# Patient Record
Sex: Male | Born: 1940 | Race: White | Hispanic: No | Marital: Married | Smoking: Never smoker
Health system: Southern US, Community
[De-identification: ages and names within clinical notes are randomized; demographics above are authoritative.]

## PROBLEM LIST (undated history)

## (undated) DIAGNOSIS — F0391 Unspecified dementia with behavioral disturbance: Secondary | ICD-10-CM

## (undated) DIAGNOSIS — F03918 Unspecified dementia, unspecified severity, with other behavioral disturbance: Secondary | ICD-10-CM

## (undated) DIAGNOSIS — G40909 Epilepsy, unspecified, not intractable, without status epilepticus: Secondary | ICD-10-CM

## (undated) DIAGNOSIS — F0280 Dementia in other diseases classified elsewhere without behavioral disturbance: Secondary | ICD-10-CM

## (undated) DIAGNOSIS — I10 Essential (primary) hypertension: Secondary | ICD-10-CM

## (undated) DIAGNOSIS — G309 Alzheimer's disease, unspecified: Secondary | ICD-10-CM

## (undated) HISTORY — DX: Epilepsy, unspecified, not intractable, without status epilepticus: G40.909

## (undated) HISTORY — DX: Unspecified dementia, unspecified severity, with other behavioral disturbance: F03.918

## (undated) HISTORY — DX: Unspecified dementia with behavioral disturbance: F03.91

## (undated) HISTORY — DX: Dementia in other diseases classified elsewhere, unspecified severity, without behavioral disturbance, psychotic disturbance, mood disturbance, and anxiety: F02.80

## (undated) HISTORY — DX: Alzheimer's disease, unspecified: G30.9

## (undated) HISTORY — DX: Essential (primary) hypertension: I10

---

## 2005-07-01 ENCOUNTER — Ambulatory Visit: Payer: Self-pay | Admitting: Internal Medicine

## 2005-09-05 ENCOUNTER — Ambulatory Visit: Payer: Self-pay | Admitting: Psychiatry

## 2008-01-02 ENCOUNTER — Inpatient Hospital Stay: Payer: Self-pay | Admitting: Internal Medicine

## 2008-01-02 ENCOUNTER — Other Ambulatory Visit: Payer: Self-pay

## 2010-09-26 ENCOUNTER — Emergency Department: Payer: Self-pay | Admitting: Emergency Medicine

## 2010-10-01 ENCOUNTER — Emergency Department: Payer: Self-pay | Admitting: Emergency Medicine

## 2010-12-14 ENCOUNTER — Emergency Department (HOSPITAL_COMMUNITY)
Admission: EM | Admit: 2010-12-14 | Discharge: 2010-12-14 | Disposition: A | Discharge status: Home / Self Care | Payer: Medicare Other | Attending: Emergency Medicine | Admitting: Emergency Medicine

## 2010-12-14 DIAGNOSIS — Y93E8 Activity, other personal hygiene: Secondary | ICD-10-CM | POA: Insufficient documentation

## 2010-12-14 DIAGNOSIS — S51009A Unspecified open wound of unspecified elbow, initial encounter: Secondary | ICD-10-CM | POA: Insufficient documentation

## 2010-12-14 DIAGNOSIS — F0390 Unspecified dementia without behavioral disturbance: Secondary | ICD-10-CM | POA: Insufficient documentation

## 2010-12-14 DIAGNOSIS — Y9210 Unspecified residential institution as the place of occurrence of the external cause: Secondary | ICD-10-CM | POA: Insufficient documentation

## 2010-12-14 DIAGNOSIS — W1809XA Striking against other object with subsequent fall, initial encounter: Secondary | ICD-10-CM | POA: Insufficient documentation

## 2010-12-14 DIAGNOSIS — Z79899 Other long term (current) drug therapy: Secondary | ICD-10-CM | POA: Insufficient documentation

## 2011-06-08 ENCOUNTER — Observation Stay: Payer: Self-pay | Admitting: Internal Medicine

## 2011-06-08 ENCOUNTER — Emergency Department: Payer: Self-pay | Admitting: Emergency Medicine

## 2011-07-19 ENCOUNTER — Emergency Department: Payer: Self-pay | Admitting: Emergency Medicine

## 2011-07-19 LAB — URINALYSIS, COMPLETE
Ketone: NEGATIVE
Leukocyte Esterase: NEGATIVE
Nitrite: NEGATIVE
Protein: NEGATIVE
RBC,UR: 1 /HPF (ref 0–5)
WBC UR: 1 /HPF (ref 0–5)

## 2011-07-19 LAB — BASIC METABOLIC PANEL
BUN: 18 mg/dL (ref 7–18)
Calcium, Total: 9.1 mg/dL (ref 8.5–10.1)
Chloride: 106 mmol/L (ref 98–107)
EGFR (African American): >60
EGFR (Non-African Amer.): >60
Osmolality: 285 (ref 275–301)
Potassium: 4.1 mmol/L (ref 3.5–5.1)
Sodium: 142 mmol/L (ref 136–145)

## 2011-07-19 LAB — CBC
MCH: 31.1 pg (ref 26.0–34.0)
MCHC: 34 g/dL (ref 32.0–36.0)
Platelet: 138 10*3/uL — ABNORMAL LOW (ref 150–440)
RBC: 4.14 10*6/uL — ABNORMAL LOW (ref 4.40–5.90)

## 2011-07-22 ENCOUNTER — Emergency Department: Payer: Self-pay | Admitting: Unknown Physician Specialty

## 2011-09-14 ENCOUNTER — Observation Stay: Payer: Self-pay | Admitting: Internal Medicine

## 2011-09-14 LAB — COMPREHENSIVE METABOLIC PANEL
Anion Gap: 12 (ref 7–16)
Calcium, Total: 8.9 mg/dL (ref 8.5–10.1)
Chloride: 109 mmol/L — ABNORMAL HIGH (ref 98–107)
EGFR (African American): >60
Glucose: 89 mg/dL (ref 65–99)
Potassium: 4.2 mmol/L (ref 3.5–5.1)
SGOT(AST): 30 U/L (ref 15–37)

## 2011-09-14 LAB — CBC
HCT: 36.1 % — ABNORMAL LOW (ref 40.0–52.0)
MCH: 31.6 pg (ref 26.0–34.0)
MCV: 92 fL (ref 80–100)
Platelet: 125 10*3/uL — ABNORMAL LOW (ref 150–440)
RBC: 3.93 10*6/uL — ABNORMAL LOW (ref 4.40–5.90)
RDW: 13.3 % (ref 11.5–14.5)
WBC: 4.7 10*3/uL (ref 3.8–10.6)

## 2011-09-14 LAB — CK TOTAL AND CKMB (NOT AT ARMC): CK-MB: 2.7 ng/mL (ref 0.5–3.6)

## 2011-09-14 LAB — TSH: Thyroid Stimulating Horm: 0.862 u[IU]/mL

## 2011-09-15 LAB — CBC WITH DIFFERENTIAL/PLATELET
Basophil #: 0 10*3/uL (ref 0.0–0.1)
Basophil %: 0.5 %
Eosinophil #: 0.1 10*3/uL (ref 0.0–0.7)
Eosinophil %: 1.9 %
HCT: 36.3 % — ABNORMAL LOW (ref 40.0–52.0)
HGB: 12.5 g/dL — ABNORMAL LOW (ref 13.0–18.0)
Lymphocyte #: 1.3 10*3/uL (ref 1.0–3.6)
MCH: 31.5 pg (ref 26.0–34.0)
MCV: 91 fL (ref 80–100)
Monocyte %: 4.8 %
Neutrophil #: 3.3 10*3/uL (ref 1.4–6.5)
Platelet: 110 10*3/uL — ABNORMAL LOW (ref 150–440)
RBC: 3.98 10*6/uL — ABNORMAL LOW (ref 4.40–5.90)

## 2011-09-15 LAB — BASIC METABOLIC PANEL
Anion Gap: 12 (ref 7–16)
BUN: 14 mg/dL (ref 7–18)
Calcium, Total: 8.4 mg/dL — ABNORMAL LOW (ref 8.5–10.1)
Chloride: 108 mmol/L — ABNORMAL HIGH (ref 98–107)
EGFR (Non-African Amer.): >60
Glucose: 83 mg/dL (ref 65–99)
Osmolality: 288 (ref 275–301)
Potassium: 3.7 mmol/L (ref 3.5–5.1)

## 2011-09-15 LAB — CK TOTAL AND CKMB (NOT AT ARMC): CK, Total: 45 U/L (ref 35–232)

## 2011-09-15 LAB — TROPONIN I: Troponin-I: <0.02 ng/mL

## 2011-09-28 ENCOUNTER — Emergency Department: Payer: Self-pay | Admitting: Emergency Medicine

## 2011-09-28 LAB — URINALYSIS, COMPLETE
Bilirubin,UR: NEGATIVE
Glucose,UR: NEGATIVE mg/dL (ref 0–75)
Leukocyte Esterase: NEGATIVE
Nitrite: NEGATIVE
RBC,UR: 4 /HPF (ref 0–5)
Specific Gravity: 1.017 (ref 1.003–1.030)
Squamous Epithelial: 1
WBC UR: 9 /HPF (ref 0–5)

## 2011-09-28 LAB — CBC WITH DIFFERENTIAL/PLATELET
Basophil #: 0 10*3/uL (ref 0.0–0.1)
Basophil %: 0.3 %
HCT: 35.2 % — ABNORMAL LOW (ref 40.0–52.0)
Lymphocyte %: 11.7 %
MCV: 93 fL (ref 80–100)
Monocyte %: 5.4 %
Neutrophil #: 4.2 10*3/uL (ref 1.4–6.5)
Neutrophil %: 82 %
RBC: 3.79 10*6/uL — ABNORMAL LOW (ref 4.40–5.90)
RDW: 14.2 % (ref 11.5–14.5)
WBC: 5.2 10*3/uL (ref 3.8–10.6)

## 2011-09-28 LAB — COMPREHENSIVE METABOLIC PANEL
Anion Gap: 8 (ref 7–16)
Calcium, Total: 8.4 mg/dL — ABNORMAL LOW (ref 8.5–10.1)
Chloride: 109 mmol/L — ABNORMAL HIGH (ref 98–107)
Co2: 24 mmol/L (ref 21–32)
EGFR (African American): >60
EGFR (Non-African Amer.): >60
Potassium: 3.6 mmol/L (ref 3.5–5.1)
SGOT(AST): 40 U/L — ABNORMAL HIGH (ref 15–37)
SGPT (ALT): 49 U/L
Sodium: 141 mmol/L (ref 136–145)

## 2011-12-14 ENCOUNTER — Other Ambulatory Visit (HOSPITAL_COMMUNITY): Payer: Self-pay | Admitting: Family Medicine

## 2011-12-14 DIAGNOSIS — R52 Pain, unspecified: Secondary | ICD-10-CM

## 2011-12-14 DIAGNOSIS — W19XXXA Unspecified fall, initial encounter: Secondary | ICD-10-CM

## 2011-12-16 ENCOUNTER — Ambulatory Visit (HOSPITAL_COMMUNITY): Admission: RE | Admit: 2011-12-16 | Payer: Medicare Other | Source: Ambulatory Visit

## 2011-12-16 ENCOUNTER — Ambulatory Visit (HOSPITAL_COMMUNITY)
Admission: RE | Admit: 2011-12-16 | Discharge: 2011-12-16 | Disposition: A | Discharge status: Home / Self Care | Payer: Medicare Other | Source: Ambulatory Visit | Attending: Family Medicine | Admitting: Family Medicine

## 2011-12-16 DIAGNOSIS — W19XXXA Unspecified fall, initial encounter: Secondary | ICD-10-CM | POA: Insufficient documentation

## 2011-12-16 DIAGNOSIS — R52 Pain, unspecified: Secondary | ICD-10-CM

## 2011-12-16 DIAGNOSIS — M5137 Other intervertebral disc degeneration, lumbosacral region: Secondary | ICD-10-CM | POA: Insufficient documentation

## 2011-12-16 DIAGNOSIS — S32509A Unspecified fracture of unspecified pubis, initial encounter for closed fracture: Secondary | ICD-10-CM | POA: Insufficient documentation

## 2011-12-16 DIAGNOSIS — M25559 Pain in unspecified hip: Secondary | ICD-10-CM | POA: Insufficient documentation

## 2011-12-16 DIAGNOSIS — M51379 Other intervertebral disc degeneration, lumbosacral region without mention of lumbar back pain or lower extremity pain: Secondary | ICD-10-CM | POA: Insufficient documentation

## 2012-01-10 ENCOUNTER — Ambulatory Visit: Payer: Medicare Other | Admitting: Orthopedic Surgery

## 2012-01-11 ENCOUNTER — Encounter: Payer: Self-pay | Admitting: Orthopedic Surgery

## 2012-01-11 ENCOUNTER — Ambulatory Visit (INDEPENDENT_AMBULATORY_CARE_PROVIDER_SITE_OTHER): Payer: Medicare Other | Admitting: Orthopedic Surgery

## 2012-01-11 VITALS — BP 120/80 | Ht 66.0 in | Wt 110.0 lb

## 2012-01-11 DIAGNOSIS — S329XXA Fracture of unspecified parts of lumbosacral spine and pelvis, initial encounter for closed fracture: Secondary | ICD-10-CM

## 2012-01-11 NOTE — Patient Instructions (Signed)
activities as tolerated 

## 2012-01-11 NOTE — Progress Notes (Signed)
  Subjective:    Patient ID: Luke Woods, male    DOB: 02-Aug-1940, 71 y.o.   MRN: [id number] Chief Complaint  Patient presents with  . Fracture    fracture left pubic rami     HPI Referral made by Dr. Jorene Guest  This patient is noncommunicative. He fractured his pelvis back on 11/26/2011 since that time his gait pattern has improved. His caregivers are here with him and they have filled out his medical information it is somewhat incomplete. Patient is reported to have some anxiety. He has dementia. His CT scan showed a fairly benign nondisplaced left superior and inferior pubic ramus fracture diagnosed on CT    Review of Systems  Unable to perform ROS: Dementia       Objective:   Physical Exam  Musculoskeletal:       Looks to be in good overall condition he is well-kept well-groomed is not oriented to place or time. Overall affect flat  He is ambulatory with shuffling gait and assistance  He has some restriction of motion in the right hip otherwise knee and ankle normal on that side. Left hip knee and ankle normal. Muscle tone normal. Good pulses distally.            Assessment & Plan:  CT scan looks to show benign pelvic fracture  No additional recommendations based on his gait pattern today which has returned back to normal

## 2012-05-22 ENCOUNTER — Emergency Department: Payer: Self-pay | Admitting: Emergency Medicine

## 2012-06-10 DEATH — deceased

## 2012-11-21 IMAGING — CR DG CHEST 2V
1 series · 2 of 2 positions shown · non-contrast
Comparison: none

REASON FOR EXAM: fell confused
COMMENTS:

PROCEDURE:     DXR - DXR CHEST PA (OR AP) AND LATERAL  - June 08, 2011  [DATE]
RESULT:     The lungs are clear. The cardiac silhouette and visualized bony
skeleton are unremarkable.

[Series 1: view not recorded · 0.17mm/px · 2 of 2 slices shown]
[im 1/2]
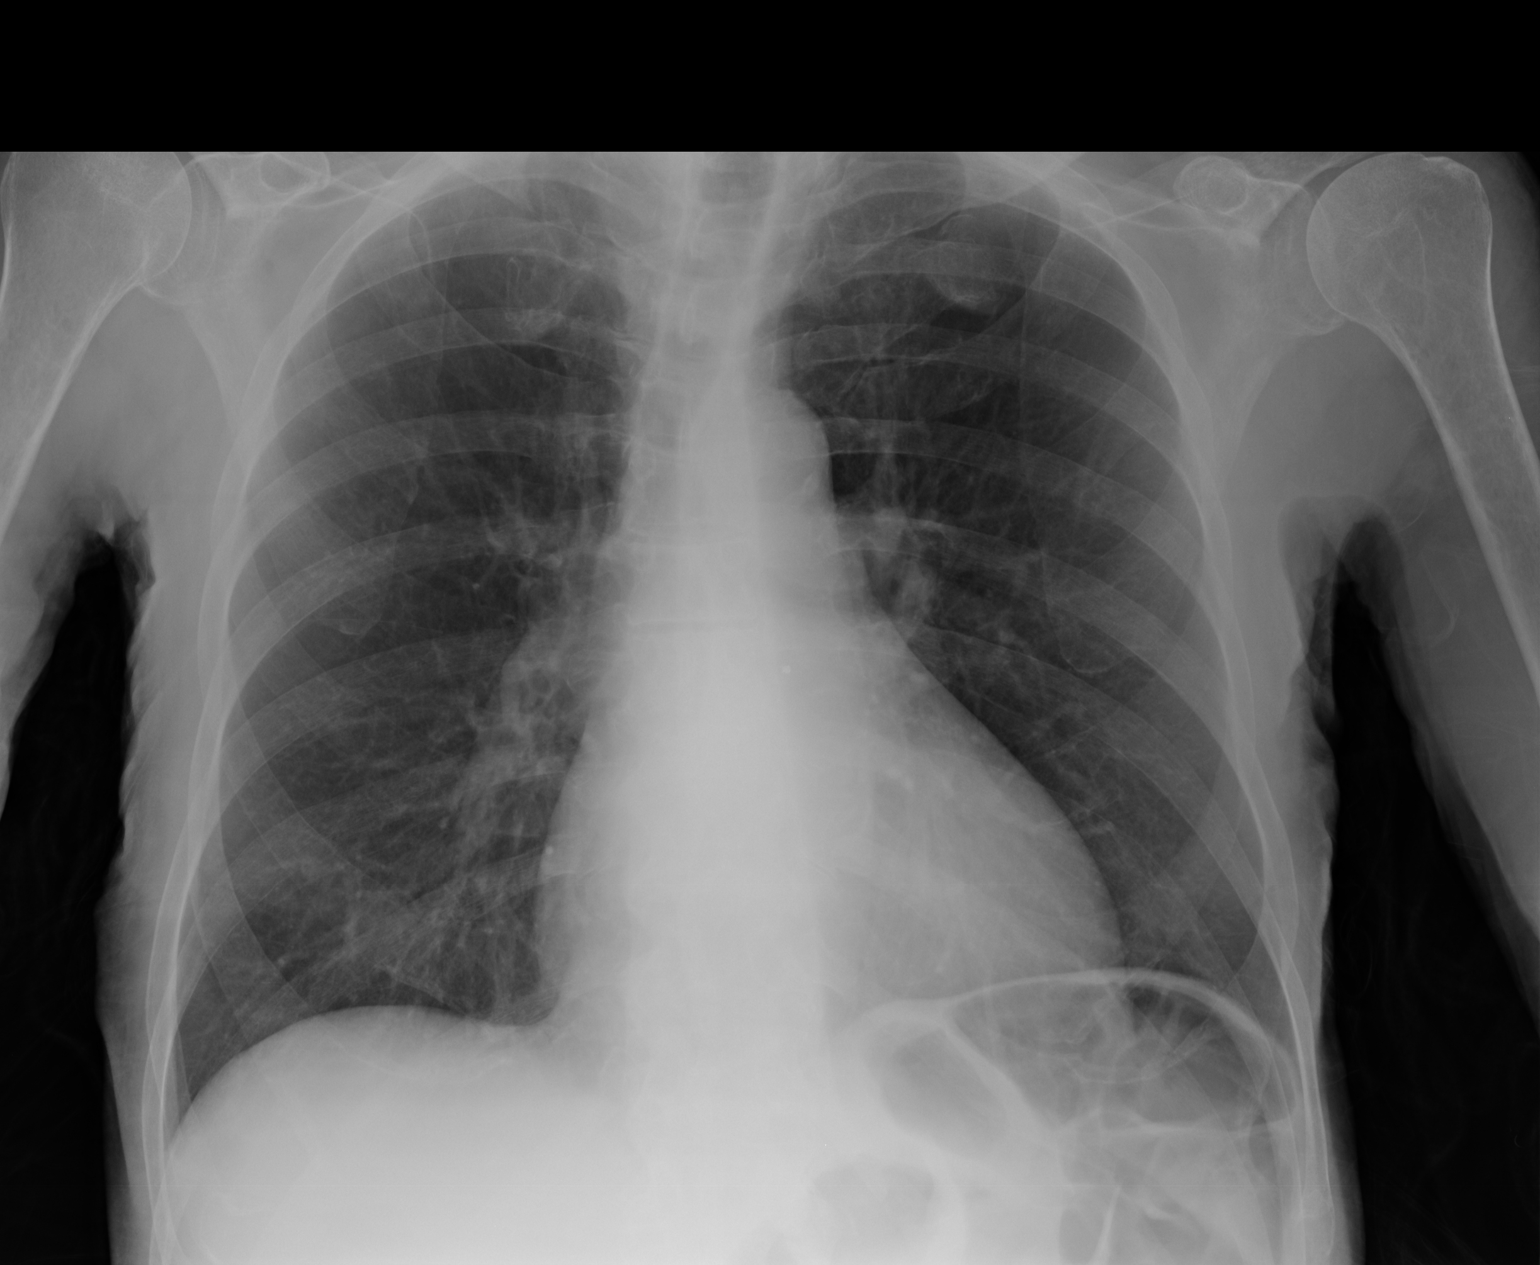
[im 2/2]
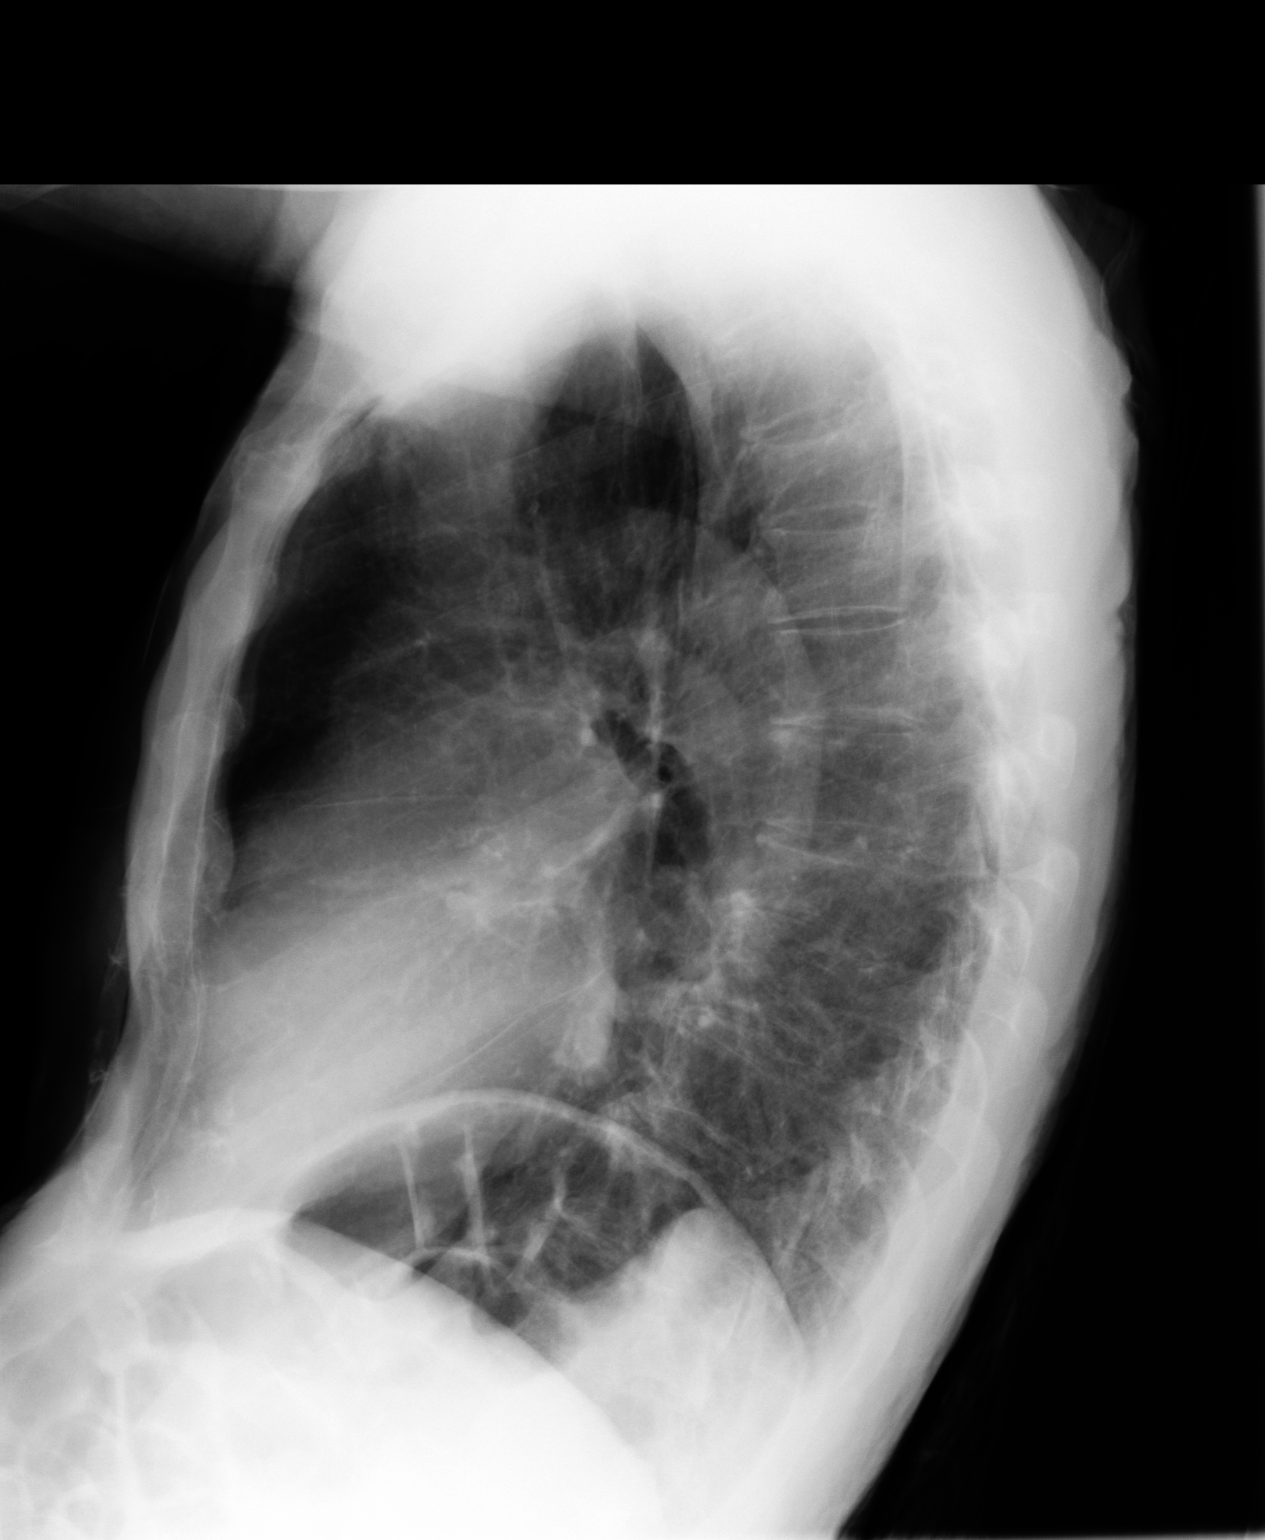

[2 of 2 positions shown; findings below may reference images not displayed]

IMPRESSION: 1. Chest radiograph without evidence of acute cardiopulmonary disease.

2. Comparison was made to prior study dated 10/01/2010.

## 2013-02-27 IMAGING — CR PELVIS - 1-2 VIEW
1 series · 1 of 1 positions shown · non-contrast
Comparison: none

REASON FOR EXAM: ams  fall left hi bruise
COMMENTS:

PROCEDURE:     DXR - DXR PELVIS AP ONLY  - September 14, 2011 [DATE]
RESULT:     Single view of the pelvis is obtained. There is no fracture,
dislocation or radiopaque foreign body evident.

[ap]
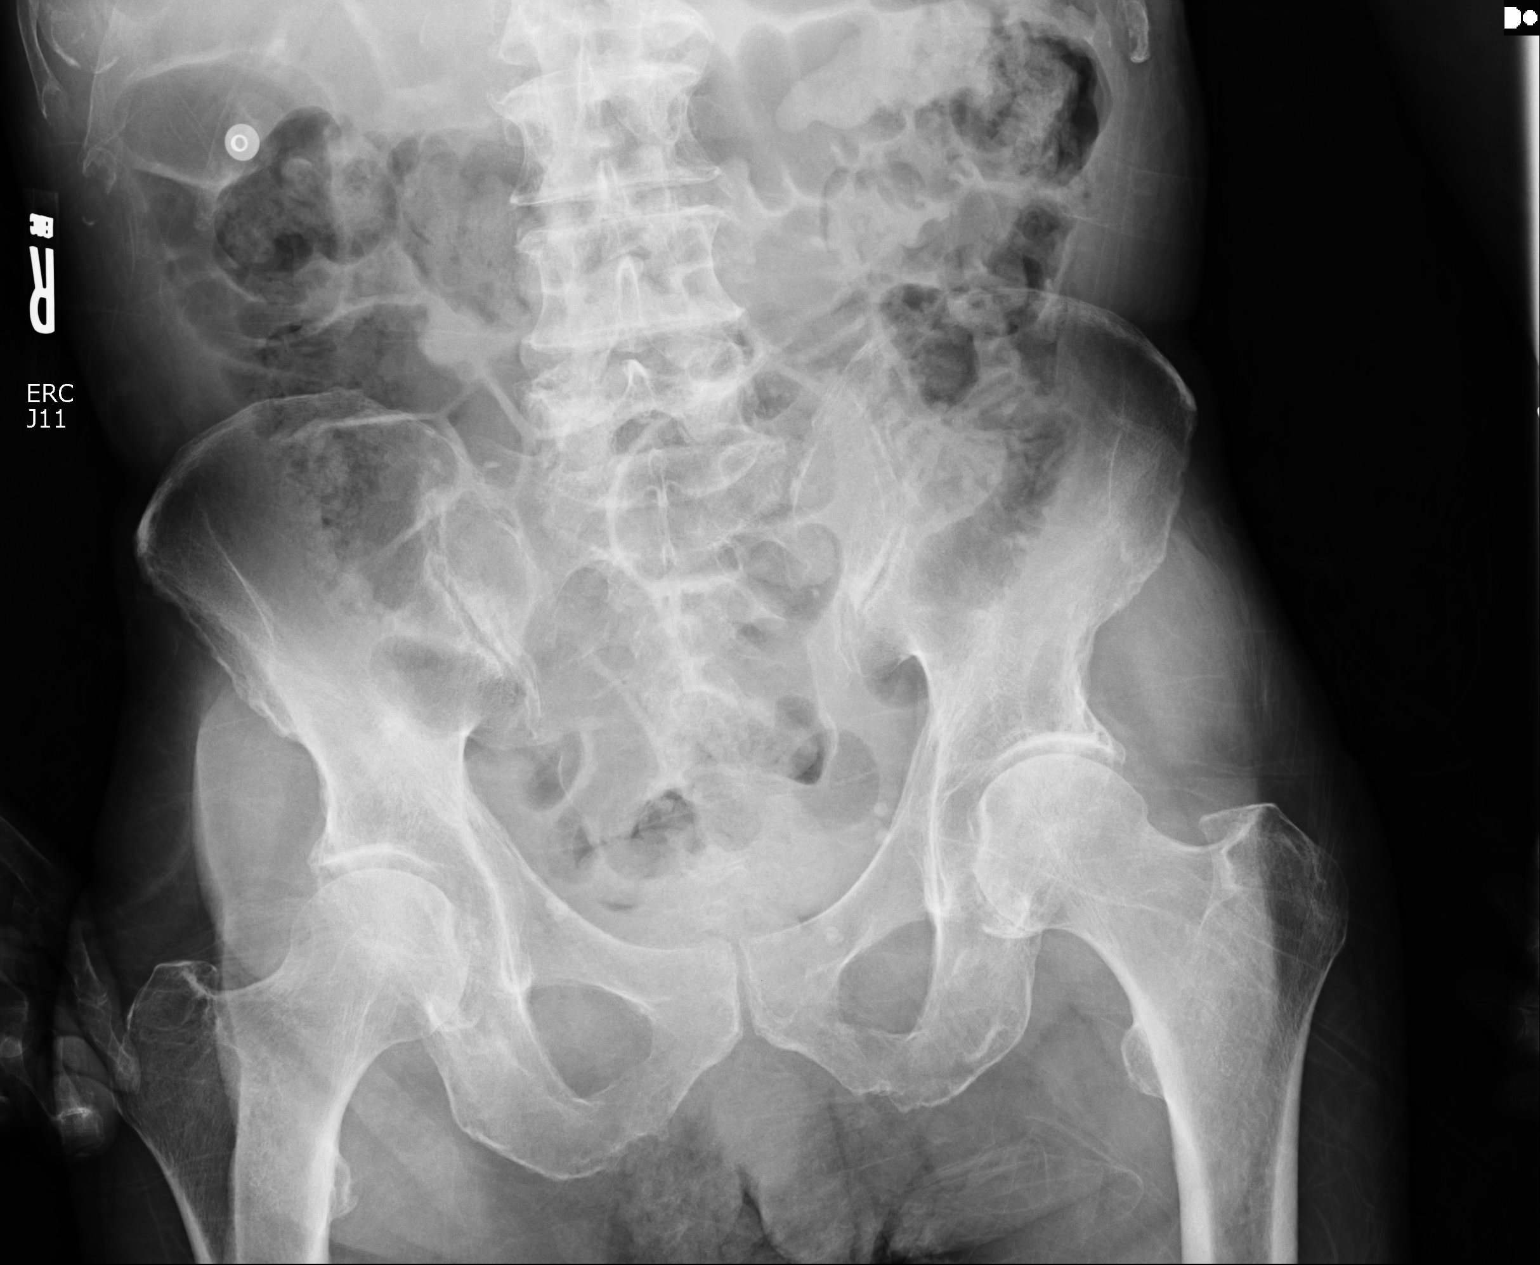

[1 of 1 positions shown; findings below may reference images not displayed]

IMPRESSION: No acute bony abnormality demonstrated.

## 2013-03-13 IMAGING — CT CT HEAD WITHOUT CONTRAST
2 series · 16 of 30 positions shown, 20 images · non-contrast
Comparison: none

REASON FOR EXAM: seizures
COMMENTS:

PROCEDURE:     CT  - CT HEAD WITHOUT CONTRAST  - September 28, 2011  [DATE]
RESULT:     Head CT dated 09/28/2011 comparison made to prior study dated
09/14/2011.
TECHNIQUE: Helical noncontrasted 5 mm sections were obtained from the skull
base to the vertex.

[Series 2: without · axial · non-contrast · 0.42mm/px · z∈[+358,+488]mm · 13 of 32 slices shown, 17 images]
[im 3/32  brain]
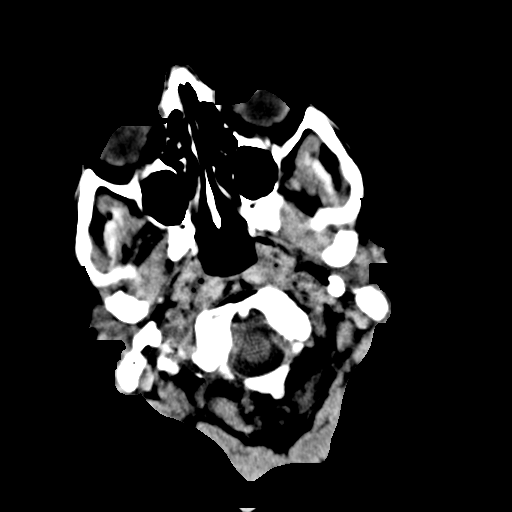
[im 3/32  bone]
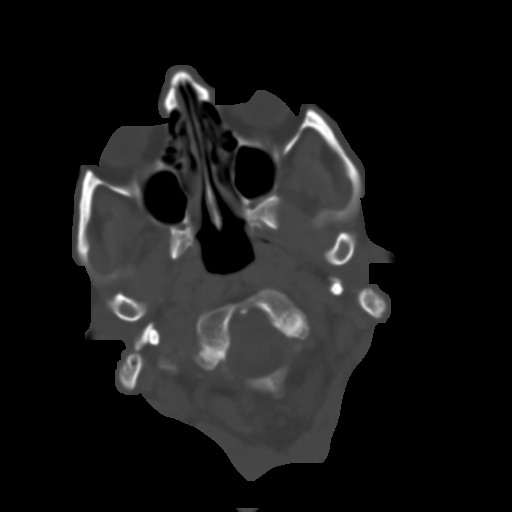
[im 5/32  brain]
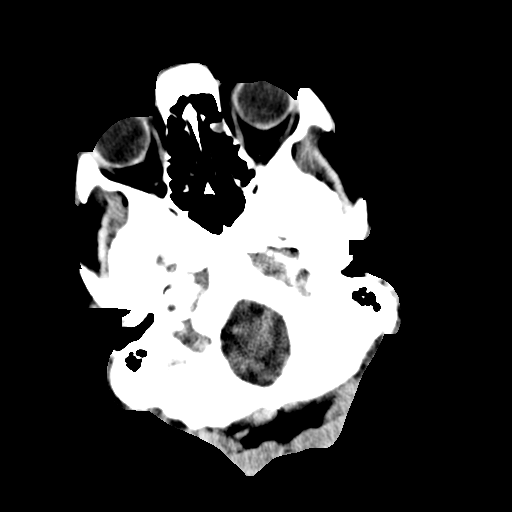
[im 7/32  brain]
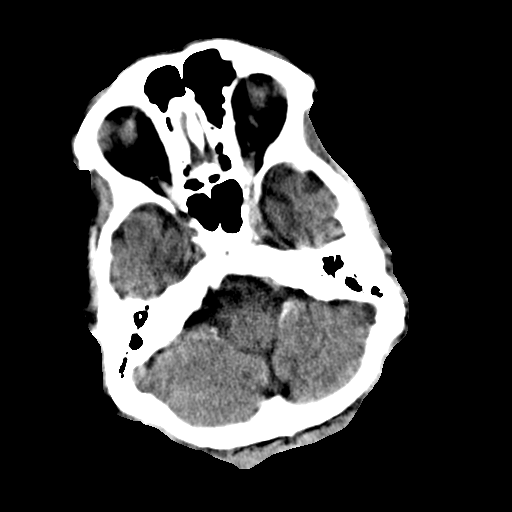
[im 9/32  brain]
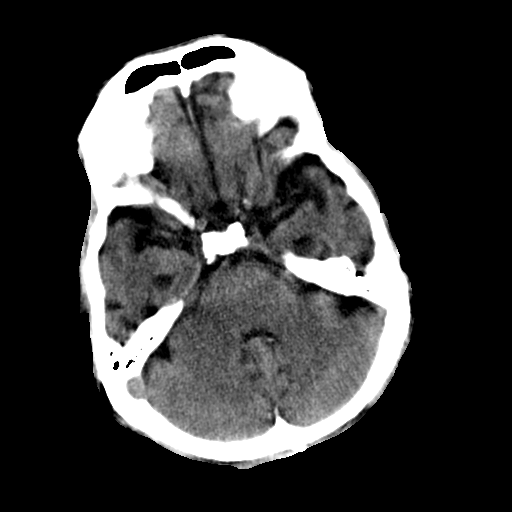
[im 12/32  brain]
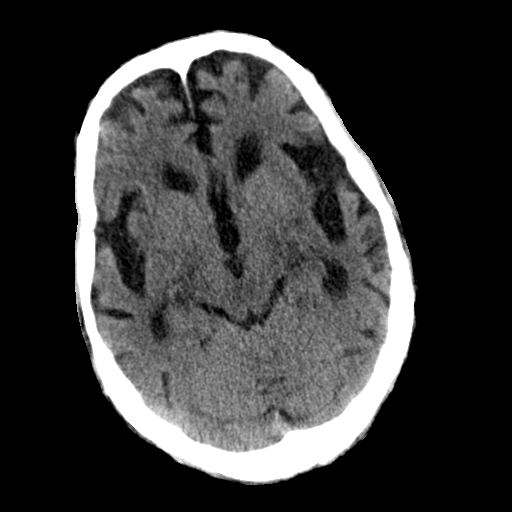
[im 12/32  bone]
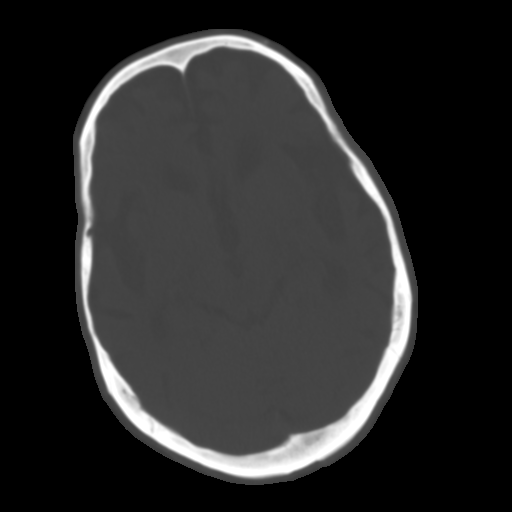
[im 14/32  brain]
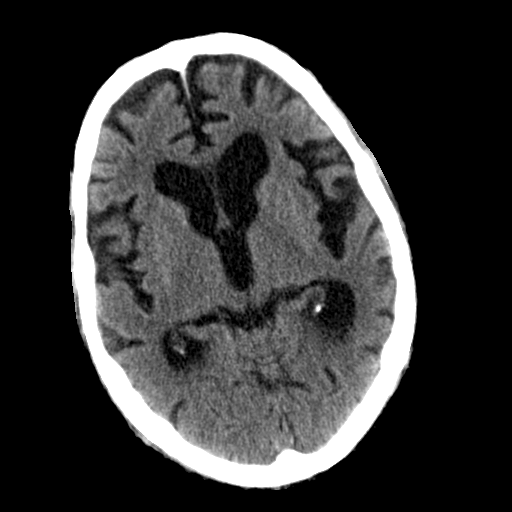
[im 16/32  brain]
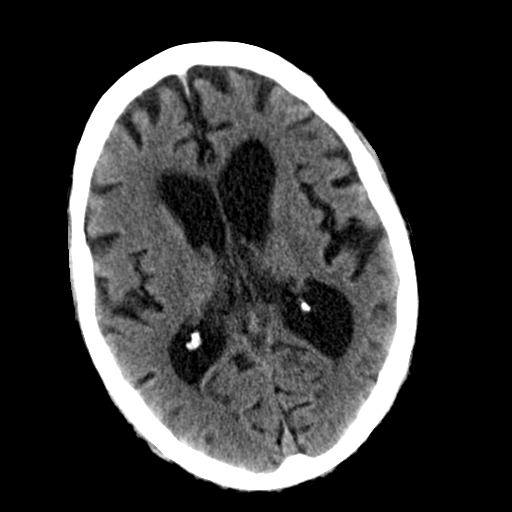
[im 18/32  brain]
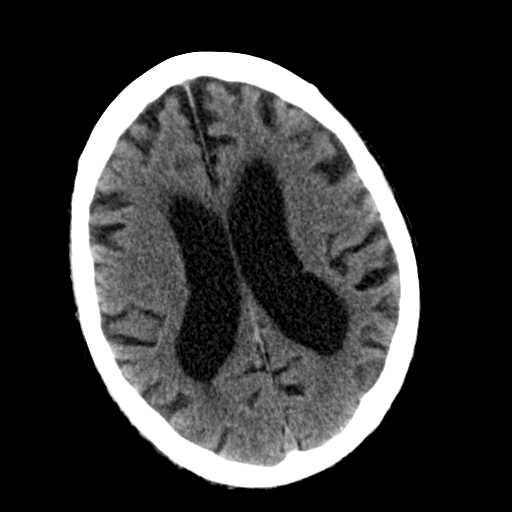
[im 20/32  brain]
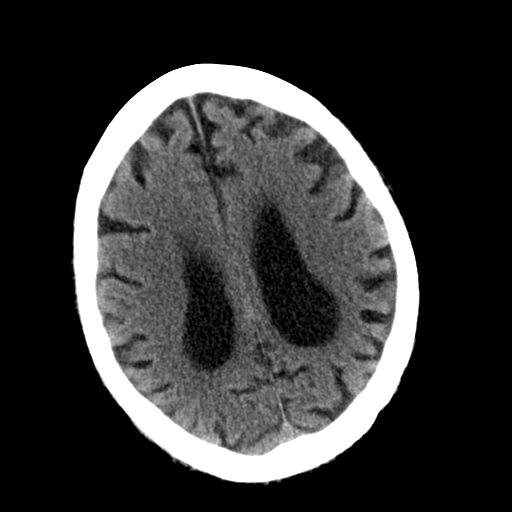
[im 20/32  bone]
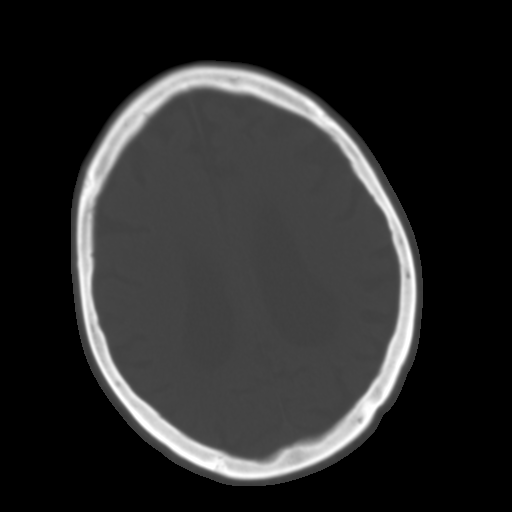
[im 23/32  brain]
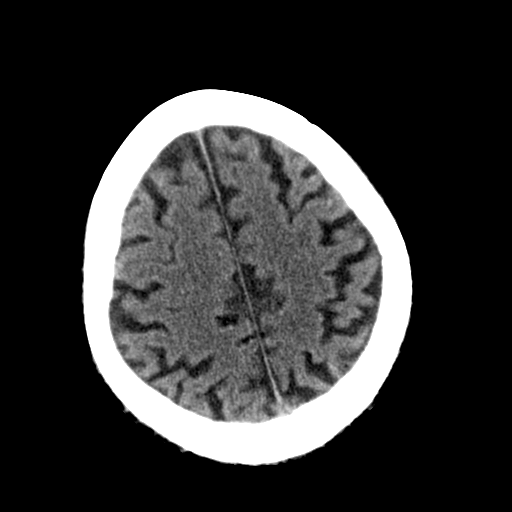
[im 25/32  brain]
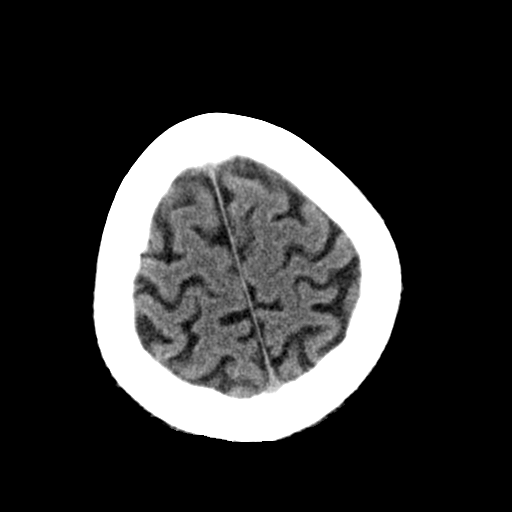
[im 27/32  brain]
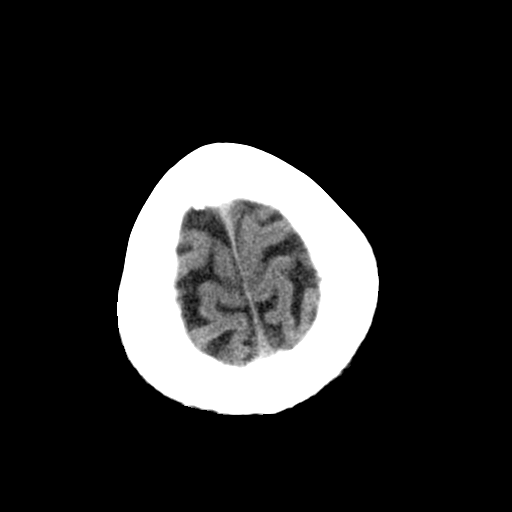
[im 29/32  brain]
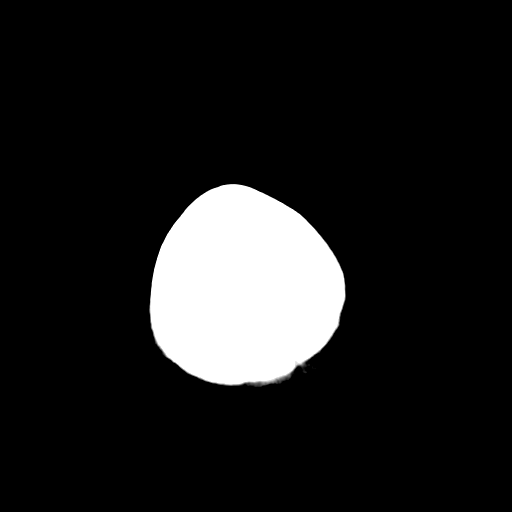
[im 29/32  bone]
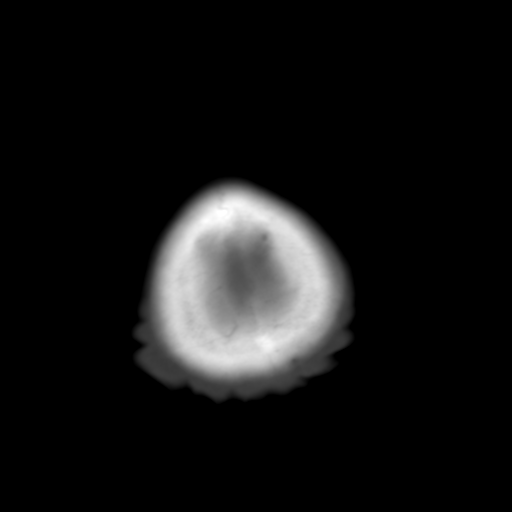

[Series 3: bone · axial · 0.42mm/px · z∈[+358,+403]mm · 3 of 32 slices shown]
[im 3/32  bone]
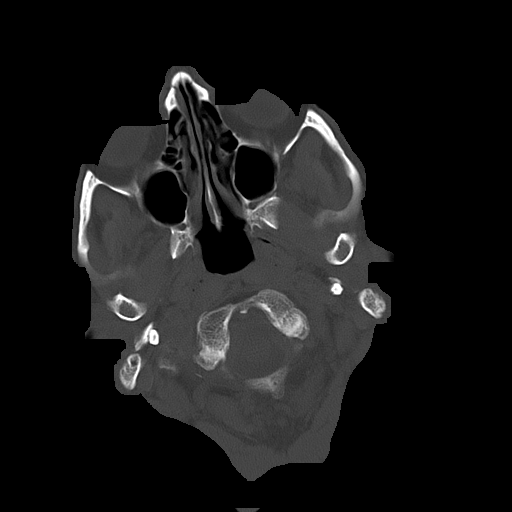
[im 7/32  bone]
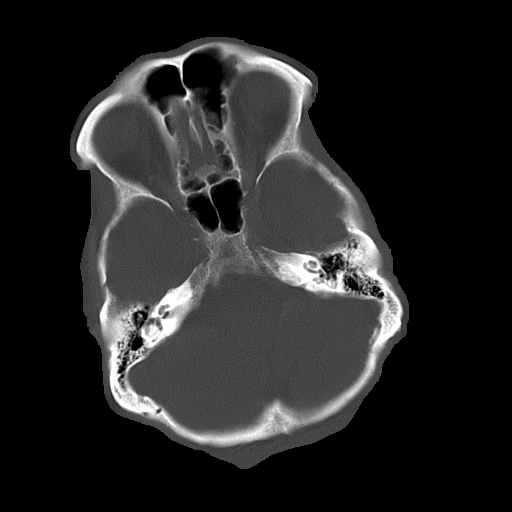
[im 12/32  bone]
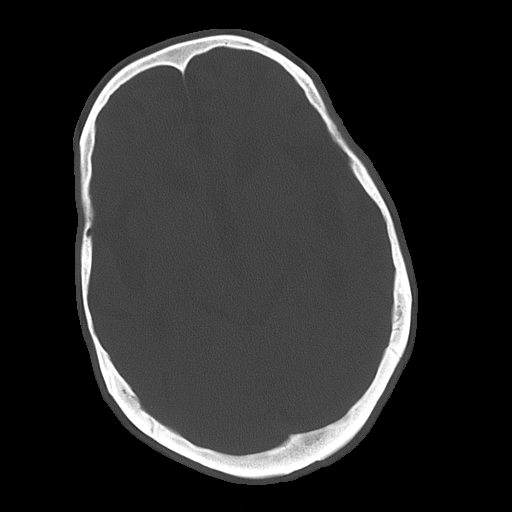

[16 of 30 positions shown; findings below may reference images not displayed]

FINDINGS: There is no evidence of intra-axial nor extra-axial fluid
collections nor evidence of acute hemorrhage. There is diffuse cortical
atrophy and hydrocephalus ex vacuo. Mild areas of low attenuation project
within the subcortical, deep, and periventricular white matter regions.
There is no evidence of mass effect, intra-axial nor extra-axial fluid
collections, nor acute hemorrhage. There is no evidence of a depressed skull
fracture. The paranasal sinuses, visualized portions unremarkable. The
mastoid air cells are patent.
IMPRESSION: Involutional changes without evidence of acute
abnormalities.

## 2014-11-02 NOTE — H&P (Signed)
PATIENT NAME:  Luke Woods, Luke Woods MR#:  [id number] DATE OF BIRTH:  1941-06-11  DATE OF ADMISSION:  09/14/2011  REFERRING PHYSICIAN: Joseph Art, MD  PRIMARY CARE PHYSICIAN: Alonna Buckler, MD  CHIEF COMPLAINT: Fall.   HISTORY OF PRESENT ILLNESS: This is a 74 year old Caucasian male with history of significant dementia who lives in a memory care unit and hypertension who was brought in after sustaining a fall this morning. Circumstances around the fall are not fully known, however, the patient apparently fell and hit his head. The patient's wife is in the room currently who is providing some history as well as further history was obtained from the chart. The patient at baseline is minimally verbal. His dementia is very severe. The patient has had a CT of the head as well as C-spine which are negative for acute fracture and acute intracranial process. He had gotten some Ativan prior to the CAT scans and is very sleepy currently. He did have some abrasions on the left lateral forehead. While in the ED, he was also found to be bradycardic, sinus in nature, while he was sleeping, down to 34. Troponin is negative. We are asked to admit the patient for observation and bradycardia.   PAST MEDICAL HISTORY:  1. Dementia. 2. Hypertension. 3. Status post cochlear transplant. 4. Vitamin B12 deficiency. 5. Deaf in one ear.   PAST SURGICAL HISTORY:  1. Cochlear transplant. 2. Leg fracture.   MEDICATIONS:  1. Lisinopril 12.5 mg daily.  2. Loratadine 10 mg daily.  3. Vitamin B12 1000 mcg daily.  4. Mapap 500 mg two tabs every six hours as needed for pain.   ALLERGIES: No known drug allergies.   FAMILY HISTORY: His father died from a brain tumor, per chart. Mother with cirrhosis, nonalcoholic in nature.   SOCIAL HISTORY: He lives in [place] dementia unit. There is no reported history of tobacco abuse, alcohol abuse, or drug use currently. He used to work as a Art therapist at [employer].  REVIEW OF SYSTEMS: Unable to obtain secondary to dementia.   PHYSICAL EXAMINATION:   VITAL SIGNS: Temperature on arrival was 96.5, pulse 46, respiratory rate 14, blood pressure 137/71, and oxygen saturation 99% on room air.   GENERAL: The patient is sleeping, arousable.   HEENT: Slight area of perhaps 1.5 cm abrasion of left lateral forehead. Some edema. Pupils are equal and reactive. Moist mucous membranes   NECK: Supple. No JVD. No thyromegaly.   LUNGS: Clear to auscultation bilaterally. No rhonchi or wheezing.   HEART: Bradycardiac. No significant murmurs are appreciated.   ABDOMEN: Soft, nontender, and nondistended. No organomegaly noted.   EXTREMITIES: No significant edema.   SKIN: Abrasions as above.  NEUROLOGIC: Unable to do full neuro examination. The patient moves his upper extremities spontaneously.   PSYCH: Unable to assess secondary to dementia.   LABS/STUDIES: Sodium 146, chloride 109, potassium 4.2, creatinine 0.75, BUN 17, glucose 89. LFTs within normal limits. Troponin negative x1. WBC 4.7, hemoglobin 12.4, hematocrit 36.1, platelets 125.   EKG: Sinus bradycardia rate of 49. Normal PR, QRS, and QTc durations. No acute ST elevations or depressions.   IMAGING: CT of the cervical spine shows multilevel, multifactorial degenerative changes without evidence of acute osseous abnormality.   CT of the head without contrast showed involutional changes without evidence of acute abnormalities.   X-ray of AP pelvis: No acute bony abnormalities.   Chest, PA and lateral: No acute cardiopulmonary disease.   ASSESSMENT AND PLAN: We have  a 74 year old male with severe dementia living in a dementia unit, hypertension, and status post fall, unclear if he syncopized, with sinus bradycardia. I would admit the patient for observation. The patient is more sleepy than baseline as he has just gotten some Ativan. The patient has a CT of the head and C-spine which are negative  for acute stroke as well as C-spine fractures. He has a negative pelvic x-ray and x-ray of the chest as well. He has no fever or significant leukocytosis. He does have marked sinus bradycardia and while he was sleeping in the room it went down to as low as 34. Note, the patient has this history in the past and at that time, in November, the wife did not want any aggressive measures such as a pacemaker and the patient was on telemetry without any signs of sick sinus syndrome. We would monitor him on remote telemetry as well. Check a TSH. Echocardiogram will not be repeated as it was done in November showing a normal ejection fraction, mitral valve prolapse, and some mild MR and TR. We would control his blood pressure with lisinopril and p.r.n. IV hydralazine. Per wife the patient is not aware of his surroundings          much and has had falls in the past. Would get a physical therapy consult. The patient does have stable thrombocytopenia, which we will also monitor. The patient is DO NOT RESUSCITATE.   TOTAL TIME SPENT: 45 minutes.  ____________________________ Krystal EatonShayiq Ianna Salmela, MD sa:slb D: 09/14/2011 15:03:08 ET T: 09/14/2011 15:28:56 ET JOB#: [id number]  cc: Krystal EatonShayiq Sawyer Kahan, MD, <Dictator> Reola MosherAndrew S. Randa LynnLamb, MD Krystal EatonSHAYIQ Vernia Teem MD ELECTRONICALLY SIGNED 09/30/2011 18:40

## 2014-11-02 NOTE — Discharge Summary (Signed)
PATIENT NAME:  Luke Woods, Luke Woods MR#:  [id number] DATE OF BIRTH:  1941-05-07  DATE OF ADMISSION:  09/14/2011 DATE OF DISCHARGE:  09/16/2011  ADMITTING PHYSICIAN: Dr. Krystal EatonShayiq Ahmadzia  DISCHARGING PHYSICIAN: Dr. Enid Baasadhika Alexsus Papadopoulos  PRIMARY CARE PHYSICIAN: Dr. Alonna BucklerAndrew Lamb  DISCHARGE DIAGNOSES:  1. Fall secondary to gait unsteadiness.  2. Chronic sinus bradycardia, usually heart rate runs between 40s to 50s when awake and in upper 30s when sleeping.  3. Advanced dementia.  4. Hypertension.  5. Chronic thrombocytopenia.   DISCHARGE HOME MEDICATIONS:  1. Loratadine 10 mg p.o. daily.  2. Vitamin B12 1000 mcg p.o. daily.  3. Lisinopril 12.5 mg p.o. daily.  4. Tylenol 500 mg 2 tablets every six hours as needed for pain.  5. Haldol 0.5 mg p.o. b.i.d.   DISCHARGE HOME OXYGEN: None.   DISCHARGE DIET: Low sodium diet.   DISCHARGE ACTIVITY: As tolerated.   FOLLOW UP INSTRUCTIONS: Primary care physician follow up in 1 to 2 weeks.   LABORATORY, DIAGNOSTIC AND RADIOLOGICAL DATA: WBC 4.9, hemoglobin 12.3, hematocrit 36.3, platelet count 110.   Sodium 145, potassium 3.7, chloride 108, bicarbonate 25, BUN 14, creatinine 0.65, glucose 83, calcium 8.4. Troponins have remained negative, three sets while in the hospital. Chest x-ray on admission showing no acute cardiopulmonary disease, clear lung fields. CT of the Woods-spine showing multilevel multifactorial degenerative changes without evidence of acute osseous abnormality. CT head showing involutional changes without any acute abnormalities. TSH 0.862.   BRIEF HOSPITAL COURSE: Luke Woods is a 74 year old male with past medical history significant for advanced dementia who lives in a memory unit at assisted living facility, prior history of sinus bradycardia was brought into the hospital after he had a fall and hit his head. He was found to have a heart rate of 34 while in the Emergency Room though his troponins were negative. He was admitted for bradycardia,  possible fall related to that.  1. Sinus bradycardia and possible fall. He has known history of sinus bradycardia for which he was admitted to the hospital in 05/2011 when his heart rate was in 40s to 50s at the time. Patient does not seem to be symptomatic though he is not a great historian. We do not think that the fall could have been secondary to bradycardia, it was probably secondary to gait unsteadiness and confirmed by his wife. Patient's wife refused any further evaluation by Holter or need for pacemaker placement if needed also. He worked with physical therapy and is ready to go back today.  2. Advanced dementia with periods of agitation. Has been pretty much restless requiring a sitter while in the room. Yesterday he was supposed to be discharged but has required Ativan and Haldol IV to calm him down. Part of it could be being restricted mobility. He was committed to bedrest in the hospital secondary to fall precautions. He worked with physical therapy. He was able to walk very good and appeared in a better position. According to wife, he has been on Haldol recently, that was stopped by Dr. Randa LynnLamb about three days ago. So will put back on very low dose of Haldol, however, have to watch for further bradycardia with prolonged QT interval but he did okay while in the hospital with IV Haldol.  3. Hypertension. Continue lisinopril.  4. B12 deficiency. B12 supplements. 5. Course has been otherwise uneventful.   DISCHARGE CONDITION: Stable now but poor prognosis.   [facility]   CODE  STATUS- DNR  TIME SPENT ON DISCHARGE: 45 minutes.  ____________________________ Enid Baas, MD rk:cms D: 09/16/2011 14:17:51 ET T: 09/16/2011 14:32:25 ET  JOB#: [id number] cc: Reola Mosher. Randa Lynn, MD Enid Baas MD ELECTRONICALLY SIGNED 09/20/2011 14:00
